# Patient Record
Sex: Male | Born: 1990 | Race: White | Hispanic: Yes
Health system: Southern US, Community
[De-identification: ages and names within clinical notes are randomized; demographics above are authoritative.]

## PROBLEM LIST (undated history)

## (undated) DIAGNOSIS — Z789 Other specified health status: Secondary | ICD-10-CM

---
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
## 2019-05-15 ENCOUNTER — Ambulatory Visit: Payer: Self-pay

## 2019-05-16 ENCOUNTER — Ambulatory Visit: Payer: Self-pay | Attending: Internal Medicine

## 2019-05-16 DIAGNOSIS — Z23 Encounter for immunization: Secondary | ICD-10-CM

## 2019-05-16 NOTE — Progress Notes (Signed)
   Covid-19 Vaccination Clinic  Name:  Wirt Hemmerich    MRN: [id number] DOB: 05-05-1990  05/16/2019  Mr. Grzywacz was observed post Covid-19 immunization for 15 minutes without incident. He was provided with Vaccine Information Sheet and instruction to access the V-Safe system.   Mr. Sweigert was instructed to call 911 with any severe reactions post vaccine: Marland Kitchen Difficulty breathing  . Swelling of face and throat  . A fast heartbeat  . A bad rash all over body  . Dizziness and weakness   Immunizations Administered    Name Date Dose VIS Date Route   Pfizer COVID-19 Vaccine 05/16/2019  3:15 PM 0.3 mL 01/17/2019 Intramuscular   Manufacturer: [employer], [employer]   Lot: [id number]   NDC: [address]-5

## 2019-06-09 ENCOUNTER — Ambulatory Visit: Payer: Self-pay

## 2019-06-10 ENCOUNTER — Ambulatory Visit: Payer: Self-pay | Attending: Internal Medicine

## 2019-06-16 ENCOUNTER — Ambulatory Visit: Payer: Self-pay

## 2019-07-08 ENCOUNTER — Ambulatory Visit: Payer: Self-pay | Attending: Internal Medicine

## 2019-07-08 DIAGNOSIS — Z23 Encounter for immunization: Secondary | ICD-10-CM

## 2019-07-08 NOTE — Progress Notes (Signed)
   Covid-19 Vaccination Clinic  Name:  Jamale Spangler    MRN: [id number] DOB: 09-Oct-1990  07/08/2019  Mr. Mccubbin was observed post Covid-19 immunization for 15 minutes without incident. He was provided with Vaccine Information Sheet and instruction to access the V-Safe system.   Mr. Bazzi was instructed to call 911 with any severe reactions post vaccine: Marland Kitchen Difficulty breathing  . Swelling of face and throat  . A fast heartbeat  . A bad rash all over body  . Dizziness and weakness   Immunizations Administered    Name Date Dose VIS Date Route   Pfizer COVID-19 Vaccine 07/08/2019  3:29 PM 0.3 mL 04/02/2018 Intramuscular   Manufacturer: [employer], [employer]   Lot: [id number]   NDC: [address]-6

## 2020-05-23 ENCOUNTER — Encounter (HOSPITAL_COMMUNITY): Payer: Self-pay

## 2020-05-23 ENCOUNTER — Emergency Department (HOSPITAL_COMMUNITY): Payer: Self-pay

## 2020-05-23 ENCOUNTER — Emergency Department (HOSPITAL_COMMUNITY)
Admission: EM | Admit: 2020-05-23 | Discharge: 2020-05-23 | Discharge status: Against Medical Advice | Payer: Self-pay | Attending: Emergency Medicine | Admitting: Emergency Medicine

## 2020-05-23 ENCOUNTER — Emergency Department (HOSPITAL_COMMUNITY)
Admission: EM | Admit: 2020-05-23 | Discharge: 2020-05-23 | Disposition: A | Discharge status: Home / Self Care | Payer: Self-pay | Attending: Emergency Medicine | Admitting: Emergency Medicine

## 2020-05-23 ENCOUNTER — Other Ambulatory Visit: Payer: Self-pay

## 2020-05-23 DIAGNOSIS — F17200 Nicotine dependence, unspecified, uncomplicated: Secondary | ICD-10-CM | POA: Insufficient documentation

## 2020-05-23 DIAGNOSIS — Z5321 Procedure and treatment not carried out due to patient leaving prior to being seen by health care provider: Secondary | ICD-10-CM | POA: Insufficient documentation

## 2020-05-23 DIAGNOSIS — S61511A Laceration without foreign body of right wrist, initial encounter: Secondary | ICD-10-CM | POA: Insufficient documentation

## 2020-05-23 DIAGNOSIS — Z23 Encounter for immunization: Secondary | ICD-10-CM | POA: Insufficient documentation

## 2020-05-23 DIAGNOSIS — W25XXXA Contact with sharp glass, initial encounter: Secondary | ICD-10-CM | POA: Insufficient documentation

## 2020-05-23 HISTORY — DX: Other specified health status: Z78.9

## 2020-05-23 MED ORDER — TETANUS-DIPHTH-ACELL PERTUSSIS 5-2.5-18.5 LF-MCG/0.5 IM SUSY
0.5000 mL | PREFILLED_SYRINGE | Freq: Once | INTRAMUSCULAR | Status: AC
  Administered 2020-05-23: 0.5 mL via INTRAMUSCULAR
  Filled 2020-05-23: qty 0.5

## 2020-05-23 MED ORDER — LIDOCAINE-EPINEPHRINE (PF) 2 %-1:200000 IJ SOLN
10.0000 mL | Freq: Once | INTRAMUSCULAR | Status: AC
  Administered 2020-05-23: 10 mL
  Filled 2020-05-23: qty 20

## 2020-05-23 MED ORDER — SULFAMETHOXAZOLE-TRIMETHOPRIM 800-160 MG PO TABS: 1.0000 | ORAL_TABLET | Freq: Two times a day (BID) | ORAL | 0 refills | Status: AC

## 2020-05-23 MED ORDER — IBUPROFEN 800 MG PO TABS
800.0000 mg | ORAL_TABLET | Freq: Once | ORAL | Status: AC
  Administered 2020-05-23: 800 mg via ORAL
  Filled 2020-05-23: qty 1

## 2020-05-23 NOTE — Discharge Instructions (Signed)
1. Medications: Tylenol or ibuprofen for pain, antibiotics to prevent infection 2. Treatment: ice for swelling, keep wound clean with warm soap and water and keep bandage dry 3. Follow Up: Please return in 14 days to have your stitches removed or sooner if you have concerns. Return to the emergency department for increased redness, drainage of pus from the wound   WOUND CARE  Remove bandage and wash wound gently with mild soap and warm water daily.   Continue daily cleansing with soap and water until stitches are removed.  Do not apply any ointments or creams to the wound while stitches are in place, as this may cause delayed healing. Return if you experience any of the following signs of infection: Swelling, redness, pus drainage, streaking, fever >101.0 F  Return if you experience excessive bleeding that does not stop after 15-20 minutes of constant, firm pressure.

## 2020-05-23 NOTE — ED Triage Notes (Signed)
Emergency Medicine Provider Triage Evaluation Note  Larry Everett , a 30 y.o. male  was evaluated in triage.  Pt complains of right breast laceration.  The patient sustained a laceration to the right wrist when he slammed on a drinking glass and it shattered.  He denies numbness, weakness, or right elbow pain, fever, chills.  He is right-hand-dominant.  He is unsure when his Tdap was last updated.  He endorses alcohol use last night.  Review of Systems  Positive: Wound Negative: Numbness, weakness, fever, chills, arthralgias  Physical Exam  Ht 5\' 8"  (1.727 m)   Wt 99.9 kg   BMI 33.49 kg/m  Gen:   Awake, no distress     HEENT:  Atraumatic  Resp:  Normal effort  Cardiac:  Normal rate  Abd:   Nondistended, nontender  MSK:   Moves extremities without difficulty, neurovascular intact to the right upper extremity.  There is a large, deep laceration noted to the ventral surface of the right wrist.  Wound is actively oozing when pressure dressing is removed. Neuro:  Speech clear   Medical Decision Making  Medically screening exam initiated at 5:45 AM.  Appropriate orders placed.  Talbert Trembath was informed that the remainder of the evaluation will be completed by another provider, this initial triage assessment does not replace that evaluation, and the importance of remaining in the ED until their evaluation is complete.  Clinical Impression  30 year old male who presents with a laceration to the right wrist after he slammed it down a drinking glass and it shattered.  He is unsure when his Tdap was last updated.  Wound is actively oozing pressure dressing is removed.  Will order x-ray of the right wrist to assess for foreign bodies.  He will require further work-up and evaluation with repair of the laceration in the ED.   37 A, PA-C 05/23/20 [phone]

## 2020-05-23 NOTE — ED Provider Notes (Signed)
[facility]-EMERGENCY DEPT Provider Note   CSN: [id number] Arrival date & time: 05/23/20  7341     History Chief Complaint  Patient presents with  . Extremity Laceration    Larry Everett is a 30 y.o. male presenting for evaluation of wrist laceration.  Patient states around 2:00 this morning he slammed a drink down on the table and it shattered, and he sustained a cut of his right wrist.  He has had pain at this area since.  No numbness or tingling.  He has not taken anything for pain including Tylenol ibuprofen.  He is not when his last tetanus shot was.  He has no other injuries elsewhere.  He has no medical problems, takes medications daily.  He is not on blood thinners, he is immunocompetent.  HPI     Past Medical History:  Diagnosis Date  . Known health problems: none     Patient Active Problem List   Diagnosis Date Noted  . INFECTIOUS MONONUCLEOSIS, HX OF 09/21/2006    History reviewed. No pertinent surgical history.     No family history on file.  Social History   Tobacco Use  . Smoking status: Current Some Day Smoker  . Smokeless tobacco: Never Used  Vaping Use  . Vaping Use: Never used  Substance Use Topics  . Alcohol use: Yes    Comment: Daily  . Drug use: Yes    Types: Marijuana    Home Medications Prior to Admission medications   Medication Sig Start Date End Date Taking? Authorizing Provider  sulfamethoxazole-trimethoprim (BACTRIM DS) 800-160 MG tablet Take 1 tablet by mouth 2 (two) times daily for 3 days. 05/23/20 05/26/20 Yes Joevanni Roddey, PA-C    Allergies    Penicillins and Penicillins  Review of Systems   Review of Systems  Skin: Positive for wound.  Neurological: Negative for numbness.    Physical Exam Updated Vital Signs BP (!) 153/115 (BP Location: Left Arm)   Pulse 98   Temp 97.9 F (36.6 C) (Oral)   Resp 18   Ht 5\' 8"  (1.727 m)   Wt 99.8 kg   SpO2 99%   BMI 33.45 kg/m   Physical Exam Vitals  and nursing note reviewed.  Constitutional:      General: He is not in acute distress.    Appearance: He is well-developed.  HENT:     Head: Normocephalic and atraumatic.  Pulmonary:     Effort: Pulmonary effort is normal.  Abdominal:     General: There is no distension.  Musculoskeletal:        General: Normal range of motion.     Cervical back: Normal range of motion.     Comments: Large V-shaped laceration of the right wrist, approximately 3 cm on each side.  Minimal oozing.  Full active range of motion of the wrist without pain or difficulty.  Full active range of motion of the fingers without weakness.  Strength against resistance intact of all fingers.  Good distal sensation and cap refill of all fingers.  Skin:    General: Skin is warm.     Capillary Refill: Capillary refill takes less than 2 seconds.     Findings: No rash.  Neurological:     Mental Status: He is alert and oriented to person, place, and time.     ED Results / Procedures / Treatments   Labs (all labs ordered are listed, but only abnormal results are displayed) Labs Reviewed - No data to  display  EKG None  Radiology DG Wrist Complete Right  Result Date: 05/23/2020 CLINICAL DATA:  Injury, laceration, possible foreign body. EXAM: RIGHT WRIST - COMPLETE 3+ VIEW COMPARISON:  None. FINDINGS: Osseous alignment is normal. No fracture line or displaced fracture fragment. Soft tissues about the RIGHT wrist are unremarkable. No radiodense foreign body is appreciated within the soft tissues. Overlying bandages in place. IMPRESSION: Negative. No osseous fracture or dislocation. No radiodense foreign body seen within the soft tissues. Electronically Signed   By: Bary Richard M.D.   On: 05/23/2020 07:49    Procedures .Marland KitchenLaceration Repair  Date/Time: 05/23/2020 8:27 AM Performed by: Alveria Apley, PA-C Authorized by: Alveria Apley, PA-C   Consent:    Consent obtained:  Verbal   Consent given by:  Patient    Risks discussed:  Infection, need for additional repair, nerve damage, pain, poor cosmetic result and poor wound healing Anesthesia:    Anesthesia method:  Local infiltration   Local anesthetic:  Lidocaine 2% WITH epi Laceration details:    Location:  Hand   Hand location:  R wrist   Wound length (cm): V-shaped laceration, 3 cm on each side.   Depth (mm):  8 Pre-procedure details:    Preparation:  Patient was prepped and draped in usual sterile fashion and imaging obtained to evaluate for foreign bodies Exploration:    Hemostasis achieved with:  Direct pressure   Wound exploration: wound explored through full range of motion and entire depth of wound visualized     Wound extent: no foreign bodies/material noted, no nerve damage noted, no tendon damage noted and no underlying fracture noted   Treatment:    Area cleansed with:  Povidone-iodine and saline   Amount of cleaning:  Extensive   Irrigation solution:  Sterile saline   Irrigation method:  Syringe Skin repair:    Repair method:  Sutures   Suture size:  4-0   Suture material:  Prolene   Suture technique:  Simple interrupted   Number of sutures:  11 Approximation:    Approximation:  Close Repair type:    Repair type:  Intermediate Post-procedure details:    Dressing:  Sterile dressing   Procedure completion:  Tolerated well, no immediate complications     Medications Ordered in ED Medications  ibuprofen (ADVIL) tablet 800 mg (800 mg Oral Given 05/23/20 0730)  Tdap (BOOSTRIX) injection 0.5 mL (0.5 mLs Intramuscular Given 05/23/20 0731)  lidocaine-EPINEPHrine (XYLOCAINE W/EPI) 2 %-1:200000 (PF) injection 10 mL (10 mLs Infiltration Given by Other 05/23/20 0730)    ED Course  I have reviewed the triage vital signs and the nursing notes.  Pertinent labs & imaging results that were available during my care of the patient were reviewed by me and considered in my medical decision making (see chart for details).    MDM  Rules/Calculators/A&P                          Patient presenting for evaluation of wrist laceration.  On exam, patient is neurovascularly intact.  X-ray obtained from triage viewed and interpreted by me, no fracture, dislocation, or signs of foreign body.  Tetanus updated in the ED.  Laceration repaired as described above.  Discussed aftercare instructions.  Considering it was several hours into the laceration was repaired, there is a dural laceration, and it is on the palmar wrist, will give antibiotics to prevent infection.  At this time, patient appears safe for discharge.  Return  precautions given.  Patient states he understands and agrees to plan  Final Clinical Impression(s) / ED Diagnoses Final diagnoses:  Laceration of right wrist, initial encounter    Rx / DC Orders ED Discharge Orders         Ordered    sulfamethoxazole-trimethoprim (BACTRIM DS) 800-160 MG tablet  2 times daily        05/23/20 0822           Alveria Apley, PA-C 05/23/20 0830    Tegeler, Canary Brim, MD 05/23/20 1423

## 2020-05-23 NOTE — ED Notes (Signed)
Pt not found in lobby at this time. 

## 2020-05-23 NOTE — ED Triage Notes (Signed)
Patient arrived with a laceration on his right wrist after breaking it on glass today at 2am. Last tetanus unknown.

## 2020-05-23 NOTE — ED Triage Notes (Signed)
Pt reports that he slammed down a drinking glass and it shattered. Laceration to right wrist area. Pressure dressing applied in triage.

## 2020-06-11 ENCOUNTER — Other Ambulatory Visit: Payer: Self-pay

## 2020-06-11 ENCOUNTER — Emergency Department (HOSPITAL_COMMUNITY)
Admission: EM | Admit: 2020-06-11 | Discharge: 2020-06-11 | Disposition: A | Discharge status: Home / Self Care | Payer: Self-pay | Attending: Emergency Medicine | Admitting: Emergency Medicine

## 2020-06-11 ENCOUNTER — Encounter (HOSPITAL_COMMUNITY): Payer: Self-pay

## 2020-06-11 DIAGNOSIS — Z5321 Procedure and treatment not carried out due to patient leaving prior to being seen by health care provider: Secondary | ICD-10-CM | POA: Insufficient documentation

## 2020-06-11 DIAGNOSIS — Z4802 Encounter for removal of sutures: Secondary | ICD-10-CM | POA: Insufficient documentation

## 2020-06-11 NOTE — ED Triage Notes (Signed)
Pt request suture removal from left hand. Placed 2.5 weeks ago.

## 2021-06-14 ENCOUNTER — Encounter (HOSPITAL_COMMUNITY): Payer: Self-pay

## 2021-06-14 ENCOUNTER — Other Ambulatory Visit: Payer: Self-pay

## 2021-06-14 ENCOUNTER — Emergency Department (HOSPITAL_COMMUNITY)
Admission: EM | Admit: 2021-06-14 | Discharge: 2021-06-14 | Disposition: A | Discharge status: Home / Self Care | Payer: Self-pay | Attending: Emergency Medicine | Admitting: Emergency Medicine

## 2021-06-14 DIAGNOSIS — F1410 Cocaine abuse, uncomplicated: Secondary | ICD-10-CM | POA: Insufficient documentation

## 2021-06-14 DIAGNOSIS — F10920 Alcohol use, unspecified with intoxication, uncomplicated: Secondary | ICD-10-CM

## 2021-06-14 DIAGNOSIS — Y906 Blood alcohol level of 120-199 mg/100 ml: Secondary | ICD-10-CM | POA: Insufficient documentation

## 2021-06-14 DIAGNOSIS — E876 Hypokalemia: Secondary | ICD-10-CM | POA: Insufficient documentation

## 2021-06-14 DIAGNOSIS — D75839 Thrombocytosis, unspecified: Secondary | ICD-10-CM | POA: Insufficient documentation

## 2021-06-14 DIAGNOSIS — F10129 Alcohol abuse with intoxication, unspecified: Secondary | ICD-10-CM | POA: Insufficient documentation

## 2021-06-14 DIAGNOSIS — R030 Elevated blood-pressure reading, without diagnosis of hypertension: Secondary | ICD-10-CM | POA: Insufficient documentation

## 2021-06-14 LAB — CBC WITH DIFFERENTIAL/PLATELET
Abs Immature Granulocytes: 0.11 10*3/uL — ABNORMAL HIGH (ref 0.00–0.07)
Basophils Absolute: 0.1 10*3/uL (ref 0.0–0.1)
Basophils Relative: 1 %
Eosinophils Absolute: 0.5 10*3/uL (ref 0.0–0.5)
Eosinophils Relative: 4 %
HCT: 47.9 % (ref 39.0–52.0)
Hemoglobin: 16.2 g/dL (ref 13.0–17.0)
Immature Granulocytes: 1 %
Lymphocytes Relative: 39 %
Lymphs Abs: 5.2 10*3/uL — ABNORMAL HIGH (ref 0.7–4.0)
MCH: 30.7 pg (ref 26.0–34.0)
MCHC: 33.8 g/dL (ref 30.0–36.0)
MCV: 90.7 fL (ref 80.0–100.0)
Monocytes Absolute: 1.6 10*3/uL — ABNORMAL HIGH (ref 0.1–1.0)
Monocytes Relative: 12 %
Neutro Abs: 5.9 10*3/uL (ref 1.7–7.7)
Neutrophils Relative %: 43 %
Platelets: 410 10*3/uL — ABNORMAL HIGH (ref 150–400)
RBC: 5.28 MIL/uL (ref 4.22–5.81)
RDW: 12.6 % (ref 11.5–15.5)
WBC: 13.4 10*3/uL — ABNORMAL HIGH (ref 4.0–10.5)
nRBC: 0 % (ref 0.0–0.2)

## 2021-06-14 LAB — RAPID URINE DRUG SCREEN, HOSP PERFORMED
Amphetamines: POSITIVE — AB
Barbiturates: NOT DETECTED
Benzodiazepines: NOT DETECTED
Cocaine: POSITIVE — AB
Opiates: NOT DETECTED
Tetrahydrocannabinol: POSITIVE — AB

## 2021-06-14 LAB — COMPREHENSIVE METABOLIC PANEL
ALT: 41 U/L (ref 0–44)
AST: 32 U/L (ref 15–41)
Albumin: 4.6 g/dL (ref 3.5–5.0)
Alkaline Phosphatase: 76 U/L (ref 38–126)
Anion gap: 13 (ref 5–15)
BUN: 18 mg/dL (ref 6–20)
CO2: 22 mmol/L (ref 22–32)
Calcium: 9 mg/dL (ref 8.9–10.3)
Chloride: 104 mmol/L (ref 98–111)
Creatinine, Ser: 0.96 mg/dL (ref 0.61–1.24)
GFR, Estimated: >60 mL/min (ref >60)
Glucose, Bld: 138 mg/dL — ABNORMAL HIGH (ref 70–99)
Potassium: 3.1 mmol/L — ABNORMAL LOW (ref 3.5–5.1)
Sodium: 139 mmol/L (ref 135–145)
Total Bilirubin: 0.5 mg/dL (ref 0.3–1.2)
Total Protein: 8.6 g/dL — ABNORMAL HIGH (ref 6.5–8.1)

## 2021-06-14 LAB — ETHANOL: Alcohol, Ethyl (B): 173 mg/dL — ABNORMAL HIGH (ref <10)

## 2021-06-14 LAB — CBG MONITORING, ED: Glucose-Capillary: 136 mg/dL — ABNORMAL HIGH (ref 70–99)

## 2021-06-14 MED ORDER — POTASSIUM CHLORIDE CRYS ER 20 MEQ PO TBCR: 20.0000 meq | EXTENDED_RELEASE_TABLET | Freq: Two times a day (BID) | ORAL | 0 refills | Status: AC

## 2021-06-14 MED ORDER — POTASSIUM CHLORIDE CRYS ER 20 MEQ PO TBCR
40.0000 meq | EXTENDED_RELEASE_TABLET | Freq: Once | ORAL | Status: AC
  Administered 2021-06-14: 40 meq via ORAL
  Filled 2021-06-14: qty 2

## 2021-06-14 NOTE — Discharge Instructions (Signed)
Do not use any drugs that are not prescribed for you by a physician, physician's assistant, or nurse practitioner. ?

## 2021-06-14 NOTE — ED Triage Notes (Signed)
Patient arrived to the ED to see his wife who is being seen. Patient states he started feeling terrible, flushed, and overwhelmed. Patient is diaphoretic and pale in appearance. Patient reports having a headache earlier today.  ?

## 2021-06-14 NOTE — ED Provider Notes (Signed)
?[facility]-EMERGENCY DEPT ?Provider Note ? ? ?CSN: [id number] ?Arrival date & time: 06/14/21  0214 ? ?  ? ?History ? ?Chief Complaint  ?Patient presents with  ? Weakness  ? ? ?Larry Everett is a 31 y.o. male. ? ?The history is provided by the patient.  ?Weakness ?He came to the emergency department to check on his wife, who is patient.  He apparently started feeling flushed and was noted to be diaphoretic and pale.  He states that he is feeling somewhat better now.  He does admit to heavy ethanol consumption tonight and that he had at least 2 mixed drinks and shared a bottle of wine.  He also admits to some cocaine use tonight.  He denies chest pain, heaviness, tightness, pressure.  He denies nausea or vomiting. ?  ?Home Medications ?Prior to Admission medications   ?Not on File  ?   ? ?Allergies    ?Penicillins and Penicillins   ? ?Review of Systems   ?Review of Systems  ?Neurological:  Positive for weakness.  ?All other systems reviewed and are negative. ? ?Physical Exam ?Updated Vital Signs ?BP (!) 144/79 (BP Location: Right Arm)   Pulse 90   Temp 97.7 ?F (36.5 ?C) (Oral)   Resp 18   Ht 5\' 9"  (1.753 m)   Wt 108.9 kg   SpO2 91%   BMI 35.44 kg/m?  ?Physical Exam ?Vitals and nursing note reviewed.  ?31 year old male, resting comfortably and in no acute distress. Vital signs are significant for borderline elevated blood pressure. Oxygen saturation is 91%, which is normal. ?Head is normocephalic and atraumatic. PERRLA, EOMI. Oropharynx is clear. Conjunctivae are injected bilaterally. ?Neck is nontender and supple without adenopathy or JVD. ?Back is nontender and there is no CVA tenderness. ?Lungs are clear without rales, wheezes, or rhonchi. ?Chest is nontender. ?Heart has regular rate and rhythm without murmur. ?Abdomen is soft, flat, nontender. ?Extremities have no cyanosis or edema, full range of motion is present. ?Skin is warm and dry without rash. ?Neurologic: Awake and alert, speech is  slurred consistent with alcohol intoxication, cranial nerves are intact, moves all extremities equally. ? ?ED Results / Procedures / Treatments   ?Labs ?(all labs ordered are listed, but only abnormal results are displayed) ?Labs Reviewed  ?ETHANOL - Abnormal; Notable for the following components:  ?    Result Value  ? Alcohol, Ethyl (B) 173 (*)   ? All other components within normal limits  ?CBC WITH DIFFERENTIAL/PLATELET - Abnormal; Notable for the following components:  ? WBC 13.4 (*)   ? Platelets 410 (*)   ? Lymphs Abs 5.2 (*)   ? Monocytes Absolute 1.6 (*)   ? Abs Immature Granulocytes 0.11 (*)   ? All other components within normal limits  ?RAPID URINE DRUG SCREEN, HOSP PERFORMED - Abnormal; Notable for the following components:  ? Cocaine POSITIVE (*)   ? Amphetamines POSITIVE (*)   ? Tetrahydrocannabinol POSITIVE (*)   ? All other components within normal limits  ?COMPREHENSIVE METABOLIC PANEL - Abnormal; Notable for the following components:  ? Potassium 3.1 (*)   ? Glucose, Bld 138 (*)   ? Total Protein 8.6 (*)   ? All other components within normal limits  ?CBG MONITORING, ED - Abnormal; Notable for the following components:  ? Glucose-Capillary 136 (*)   ? All other components within normal limits  ? ?Procedures ?Procedures  ?Cardiac monitor shows normal sinus rhythm, per my interpretation. ? ?Medications Ordered in ED ?  Medications  ?potassium chloride SA (KLOR-CON M) CR tablet 40 mEq (40 mEq Oral Given 06/14/21 0423)  ? ? ?ED Course/ Medical Decision Making/ A&P ?  ?                        ?Medical Decision Making ?Amount and/or Complexity of Data Reviewed ?Labs: ordered. ? ?Risk ?Prescription drug management. ? ? ?Ethanol intoxication, I do not currently see any other pathology but will check screening labs and drug screen. ? ?Drug screen is positive for cocaine, amphetamines, and tetrahydrocannabinol.  Patient states that he did take a dose of Adderall several days ago, which would account for positive  amphetamine screen.  He also admits to frequent marijuana use.  Ethanol level has come back up to 173 which does indicate significant intoxication.  He is noted to be hypokalemic with potassium of 3.1 and is given a dose of oral potassium.  Mild thrombocytosis is present of uncertain clinical significance.  He is discharged with a prescription for K-Dur, advised to not use illicit drugs. ? ?Final Clinical Impression(s) / ED Diagnoses ?Final diagnoses:  ?Alcohol intoxication, uncomplicated (HCC)  ?Cocaine abuse (HCC)  ?Hypokalemia  ?Thrombocytosis  ? ? ?Rx / DC Orders ?ED Discharge Orders   ? ?      Ordered  ?  potassium chloride SA (KLOR-CON M) 20 MEQ tablet  2 times daily       ? 06/14/21 0558  ? ?  ?  ? ?  ? ? ?  ?Dione Booze, MD ?06/14/21 0600 ? ?

## 2022-07-21 IMAGING — DX DG WRIST COMPLETE 3+V*R*
4 series · 4 of 4 positions shown · non-contrast
Comparison: None.

CLINICAL DATA: Injury, laceration, possible foreign body.

EXAM:
RIGHT WRIST - COMPLETE 3+ VIEW

[wrist ap (1 of 2)]
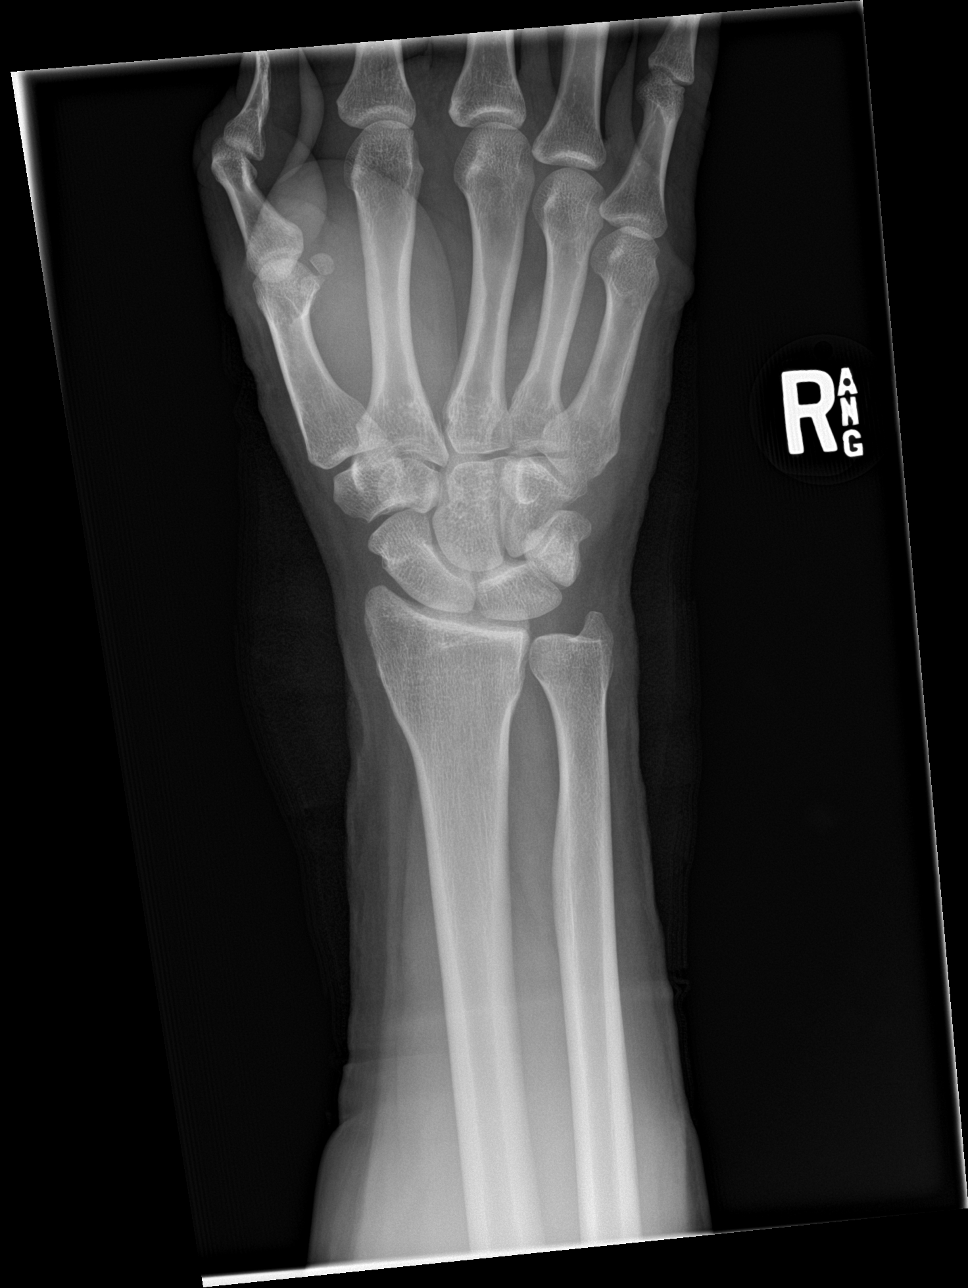

[wrist obl]
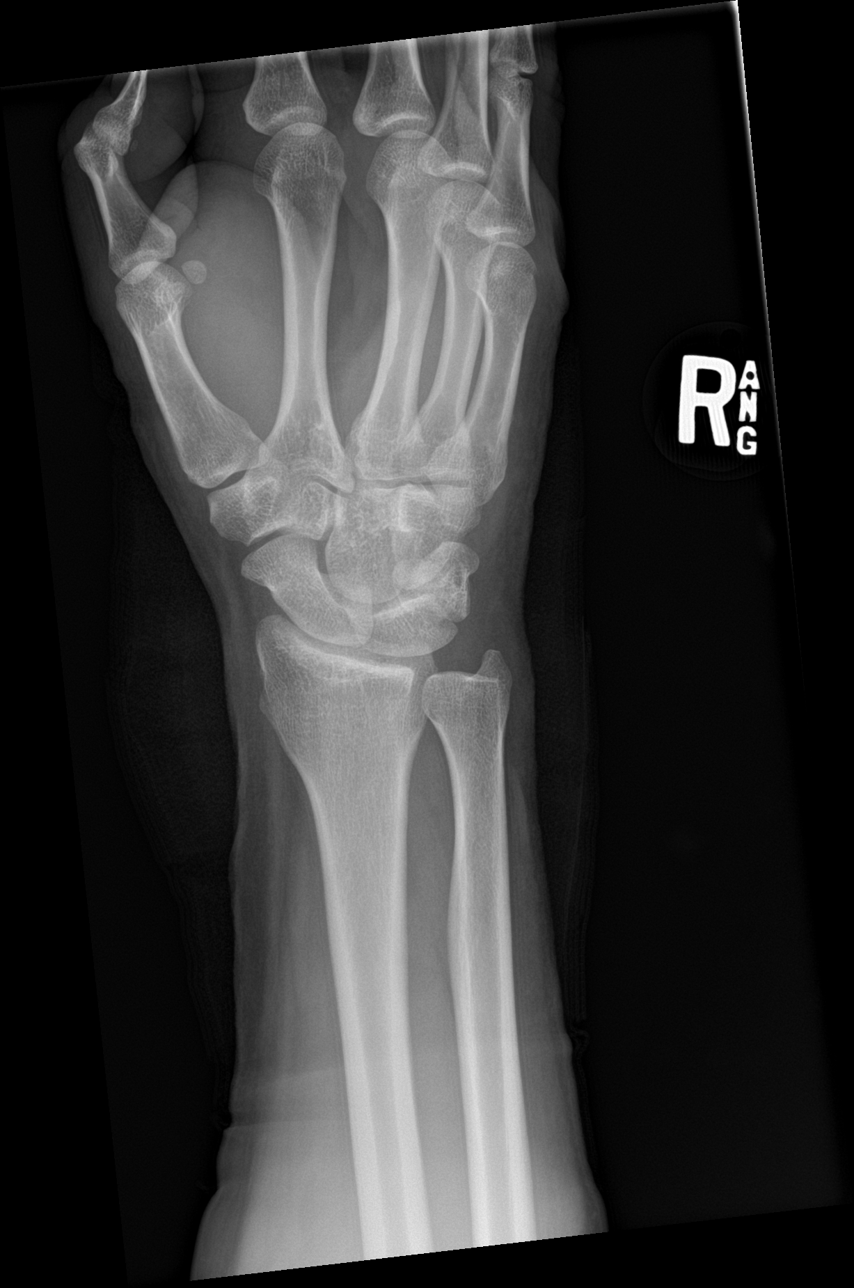

[wrist lat]
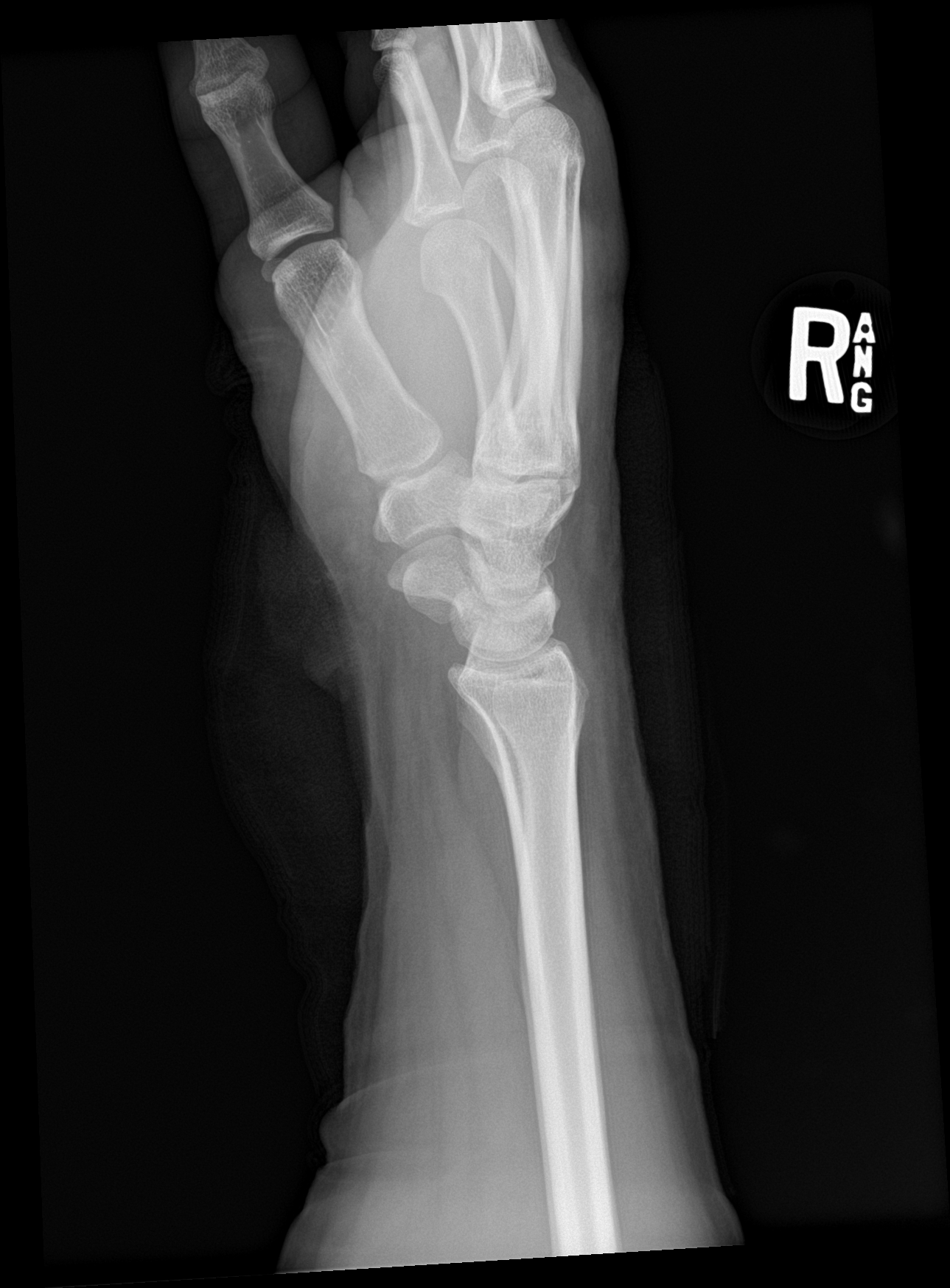

[wrist ap (2 of 2)]
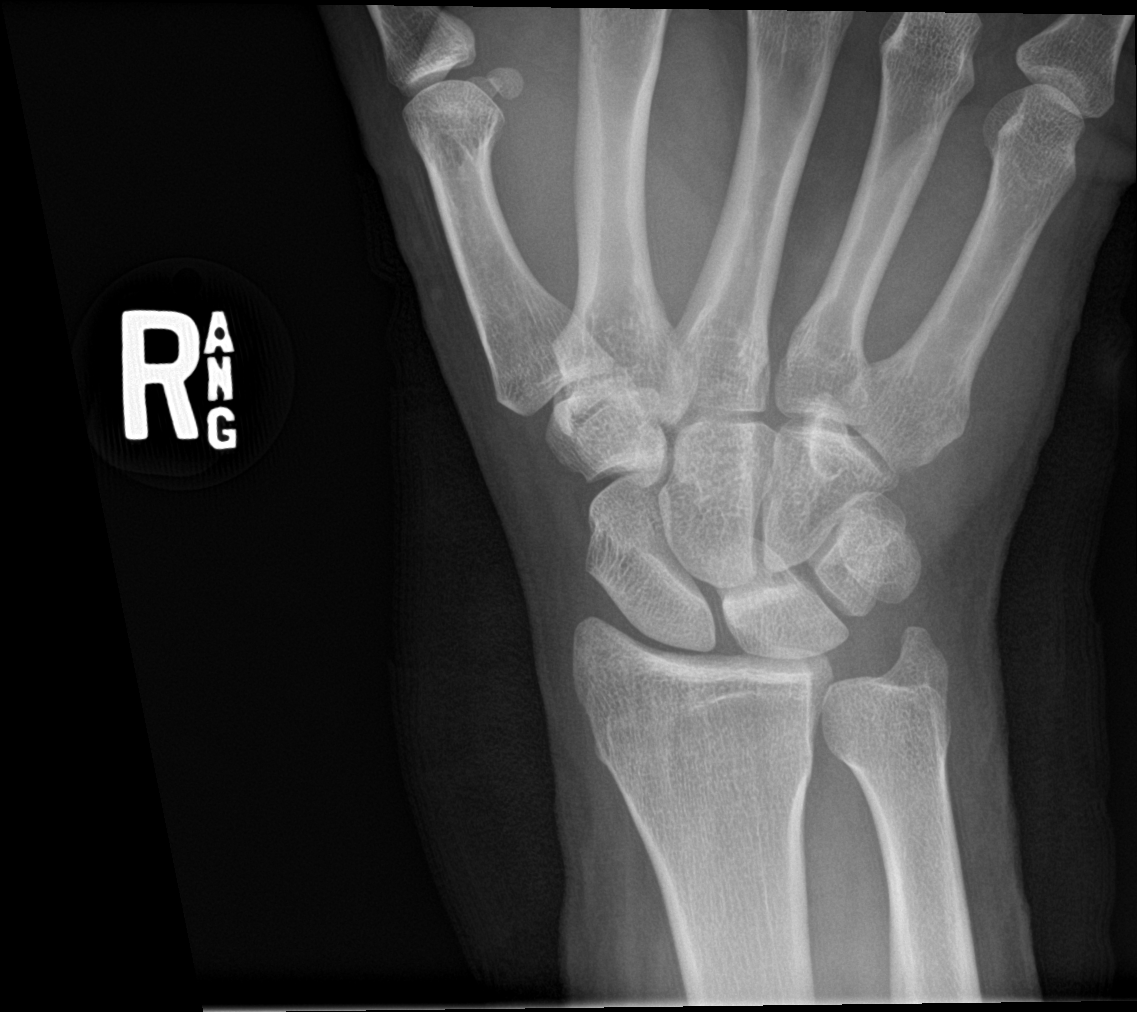

[4 of 4 positions shown; findings below may reference images not displayed]

FINDINGS: Osseous alignment is normal. No fracture line or displaced fracture
fragment. Soft tissues about the RIGHT wrist are unremarkable. No
radiodense foreign body is appreciated within the soft tissues.
Overlying bandages in place.
IMPRESSION: Negative. No osseous fracture or dislocation. No radiodense foreign
body seen within the soft tissues.
